# Patient Record
Sex: Female | Born: 1990 | Race: Black or African American | Hispanic: Yes | Marital: Single | Smoking: Never smoker
Health system: Southern US, Community
[De-identification: ages and names within clinical notes are randomized; demographics above are authoritative.]

## PROBLEM LIST (undated history)

## (undated) DIAGNOSIS — G43909 Migraine, unspecified, not intractable, without status migrainosus: Secondary | ICD-10-CM

---

## 2012-10-16 ENCOUNTER — Emergency Department (HOSPITAL_COMMUNITY)
Admission: EM | Admit: 2012-10-16 | Discharge: 2012-10-16 | Disposition: A | Discharge status: Home / Self Care | Payer: BC Managed Care – PPO | Attending: Emergency Medicine | Admitting: Emergency Medicine

## 2012-10-16 ENCOUNTER — Encounter (HOSPITAL_COMMUNITY): Payer: Self-pay | Admitting: *Deleted

## 2012-10-16 DIAGNOSIS — Y9389 Activity, other specified: Secondary | ICD-10-CM | POA: Insufficient documentation

## 2012-10-16 DIAGNOSIS — S0990XA Unspecified injury of head, initial encounter: Secondary | ICD-10-CM | POA: Insufficient documentation

## 2012-10-16 DIAGNOSIS — Y92410 Unspecified street and highway as the place of occurrence of the external cause: Secondary | ICD-10-CM | POA: Insufficient documentation

## 2012-10-16 HISTORY — DX: Migraine, unspecified, not intractable, without status migrainosus: G43.909

## 2012-10-16 MED ORDER — CYCLOBENZAPRINE HCL 5 MG PO TABS: 7.5000 mg | ORAL_TABLET | Freq: Three times a day (TID) | ORAL | Status: DC | PRN

## 2012-10-16 MED ORDER — IBUPROFEN 400 MG PO TABS
600.0000 mg | ORAL_TABLET | Freq: Once | ORAL | Status: AC
  Administered 2012-10-16: 600 mg via ORAL
  Filled 2012-10-16: qty 1

## 2012-10-16 MED ORDER — IBUPROFEN 600 MG PO TABS: 600.0000 mg | ORAL_TABLET | Freq: Four times a day (QID) | ORAL | Status: DC | PRN
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
## 2012-10-16 NOTE — ED Notes (Signed)
Pt ambulatory leaving ED. Pt given d/c teaching and prescriptions. Pt has no further questions upon d/c.
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
## 2012-10-20 ENCOUNTER — Emergency Department (HOSPITAL_COMMUNITY)
Admission: EM | Admit: 2012-10-20 | Discharge: 2012-10-20 | Disposition: A | Discharge status: Home / Self Care | Payer: BC Managed Care – PPO | Attending: Emergency Medicine | Admitting: Emergency Medicine

## 2012-10-20 ENCOUNTER — Encounter (HOSPITAL_COMMUNITY): Payer: Self-pay | Admitting: Family Medicine

## 2012-10-20 DIAGNOSIS — N390 Urinary tract infection, site not specified: Secondary | ICD-10-CM | POA: Insufficient documentation

## 2012-10-20 DIAGNOSIS — Z8679 Personal history of other diseases of the circulatory system: Secondary | ICD-10-CM | POA: Insufficient documentation

## 2012-10-20 DIAGNOSIS — R3915 Urgency of urination: Secondary | ICD-10-CM | POA: Insufficient documentation

## 2012-10-20 DIAGNOSIS — R1033 Periumbilical pain: Secondary | ICD-10-CM | POA: Insufficient documentation

## 2012-10-20 DIAGNOSIS — Z3202 Encounter for pregnancy test, result negative: Secondary | ICD-10-CM | POA: Insufficient documentation

## 2012-10-20 LAB — URINALYSIS, ROUTINE W REFLEX MICROSCOPIC
Glucose, UA: NEGATIVE mg/dL
pH: 7 (ref 5.0–8.0)

## 2012-10-20 LAB — URINE MICROSCOPIC-ADD ON

## 2012-10-20 LAB — PREGNANCY, URINE: Preg Test, Ur: NEGATIVE

## 2012-10-20 MED ORDER — PHENAZOPYRIDINE HCL 200 MG PO TABS: 200.0000 mg | ORAL_TABLET | Freq: Three times a day (TID) | ORAL | Status: DC

## 2012-10-20 MED ORDER — CEPHALEXIN 500 MG PO CAPS: 500.0000 mg | ORAL_CAPSULE | Freq: Four times a day (QID) | ORAL | Status: DC

## 2012-10-20 NOTE — ED Provider Notes (Signed)
History   This chart was scribed for Glynn Octave, MD by Toya Smothers, ED Scribe. The patient was seen in room TR11C/TR11C. Patient's care was started at 1039.   CSN: [id number]  Arrival date & time 10/20/12  1039   First MD Initiated Contact with Patient 10/20/12 1102      Chief Complaint  Patient presents with  . Urinary Tract Infection    HPI  Brooke Sheppard is a 21 y.o. female who presents to the Emergency Department complaining of 6 hours of new, sudden onset, unchanged moderate dysuria and periumbilical pain. Pain is sharp and burning. No alleviators or aggravators. Pt was well until this morning when she began experiencing symptoms. Symptoms have not been treated PTA. No hematuria, fever, chills, cough, congestion, rhinorrhea, chest pain, SOB, or n/v/d. Pt denies use of tobacco products, consumption of alcohol, and use of illicit drugs. Pt was in     Past Medical History  Diagnosis Date  . Migraines     History reviewed. No pertinent past surgical history.  History reviewed. No pertinent family history.  History  Substance Use Topics  . Smoking status: Never Smoker   . Smokeless tobacco: Not on file  . Alcohol Use: Yes    Review of Systems  Gastrointestinal: Positive for abdominal pain. Negative for nausea, vomiting and diarrhea.  Genitourinary: Positive for dysuria and urgency. Negative for hematuria.  All other systems reviewed and are negative.    Allergies  Other  Home Medications   Current Outpatient Rx  Name  Route  Sig  Dispense  Refill  . ASPIRIN-ACETAMINOPHEN-CAFFEINE 250-250-65 MG PO TABS   Oral   Take 1 tablet by mouth every 6 (six) hours as needed. For headache         . CYCLOBENZAPRINE HCL 5 MG PO TABS   Oral   Take 1.5 tablets (7.5 mg total) by mouth 3 (three) times daily as needed for muscle spasms (as needed.  Avoid driving or operating machienery while taking.  Used best at night).   10 tablet   0   . IBUPROFEN 600 MG PO TABS  Oral   Take 1 tablet (600 mg total) by mouth every 6 (six) hours as needed for pain.   30 tablet   0   . CEPHALEXIN 500 MG PO CAPS   Oral   Take 1 capsule (500 mg total) by mouth 4 (four) times daily.   40 capsule   0   . PHENAZOPYRIDINE HCL 200 MG PO TABS   Oral   Take 1 tablet (200 mg total) by mouth 3 (three) times daily.   6 tablet   0     BP 130/82  Pulse 123  Temp 97.6 F (36.4 C) (Oral)  Resp 20  SpO2 100%  LMP 10/19/2012  Physical Exam  Nursing note and vitals reviewed. Constitutional: She appears well-developed and well-nourished.  HENT:  Head: Normocephalic and atraumatic.  Eyes: Conjunctivae normal are normal. Pupils are equal, round, and reactive to light.  Neck: Neck supple. No tracheal deviation present. No thyromegaly present.       No CVA tenderness.  Cardiovascular: Normal rate and regular rhythm.   No murmur heard. Pulmonary/Chest: Effort normal and breath sounds normal.  Abdominal: Soft. Bowel sounds are normal. She exhibits no distension. There is no tenderness.       No abdominal pain.  Musculoskeletal: Normal range of motion. She exhibits no edema and no tenderness.  Neurological: She is alert. Coordination normal.  Skin:  Skin is warm and dry. No rash noted.  Psychiatric: She has a normal mood and affect.    ED Course  Procedures DIAGNOSTIC STUDIES: Oxygen Saturation is 100% on room air, normal by my interpretation.    COORDINATION OF CARE: 11:06- Evaluated Pt. Pt is awake, alert, and without distress.     Labs Reviewed  URINALYSIS, ROUTINE W REFLEX MICROSCOPIC - Abnormal; Notable for the following:    APPearance CLOUDY (*)     Hgb urine dipstick LARGE (*)     Leukocytes, UA MODERATE (*)     All other components within normal limits  URINE MICROSCOPIC-ADD ON  PREGNANCY, URINE  URINE CULTURE   No results found.   1. Urinary tract infection       MDM  Dysuria and odorous urine for the past day. No abdominal pain, fever,  vomiting, vaginal discharge. On menses now. Patient appears well. Abdomen soft and nonsurgical. Heart rate improved with rechecked to 86.  +LE, WBCs, blood.  On menses.  Culture sent.  HCG negative.  Start keflex, send urine culture.   I personally performed the services described in this documentation, which was scribed in my presence. The recorded information has been reviewed and is accurate.    Glynn Octave, MD 10/20/12 [phone]

## 2012-10-20 NOTE — ED Notes (Signed)
Per pt sts this morning burning in urine and foul odor. sts she think she has a UTI.

## 2012-10-21 LAB — URINE CULTURE

## 2019-02-21 ENCOUNTER — Other Ambulatory Visit: Payer: Self-pay

## 2019-02-21 ENCOUNTER — Ambulatory Visit: Payer: Self-pay

## 2019-02-21 ENCOUNTER — Emergency Department (HOSPITAL_COMMUNITY)
Admission: EM | Admit: 2019-02-21 | Discharge: 2019-02-21 | Disposition: A | Discharge status: Home / Self Care | Payer: 59 | Attending: Emergency Medicine | Admitting: Emergency Medicine

## 2019-02-21 DIAGNOSIS — Z20828 Contact with and (suspected) exposure to other viral communicable diseases: Secondary | ICD-10-CM | POA: Diagnosis not present

## 2019-02-21 DIAGNOSIS — R05 Cough: Secondary | ICD-10-CM | POA: Diagnosis not present

## 2019-02-21 DIAGNOSIS — Z79899 Other long term (current) drug therapy: Secondary | ICD-10-CM | POA: Insufficient documentation

## 2019-02-21 DIAGNOSIS — R51 Headache: Secondary | ICD-10-CM | POA: Diagnosis not present

## 2019-02-21 DIAGNOSIS — Z20822 Contact with and (suspected) exposure to covid-19: Secondary | ICD-10-CM

## 2019-02-21 MED ORDER — BENZONATATE 100 MG PO CAPS: 100.0000 mg | ORAL_CAPSULE | Freq: Three times a day (TID) | ORAL | 0 refills | Status: DC

## 2019-02-21 NOTE — ED Provider Notes (Signed)
[facility]-EMERGENCY DEPT Provider Note   CSN: [id number] Arrival date & time: 02/21/19  1610    History   Chief Complaint Chief Complaint  Patient presents with  . Headache  . Cough    HPI Hikari Durwin NoraDixon is a 28 y.o. female.     28 yo F with a cc of being exposed to Covid 19.  Brother diagnosed with it recently, saw him 4 days ago and was in close contact.  Having a mild cough, headache with coughing.  CP yesterday but resolved.  No sob.  No fevers.  No other exposure.   The history is provided by the patient.  Illness  Severity:  Mild Onset quality:  Gradual Duration:  2 days Timing:  Constant Progression:  Unchanged Chronicity:  New Associated symptoms: chest pain (yesterday now resolved), cough and headaches   Associated symptoms: no congestion, no fever, no myalgias, no nausea, no rhinorrhea, no shortness of breath, no vomiting and no wheezing     Past Medical History:  Diagnosis Date  . Migraines     There are no active problems to display for this patient.   No past surgical history on file.   OB History   No obstetric history on file.      Home Medications    Prior to Admission medications   Medication Sig Start Date End Date Taking? Authorizing Provider  aspirin-acetaminophen-caffeine (EXCEDRIN MIGRAINE) [id number] MG per tablet Take 1 tablet by mouth every 6 (six) hours as needed. For headache    [provider]  benzonatate (TESSALON) 100 MG capsule Take 1 capsule (100 mg total) by mouth every 8 (eight) hours. 02/21/19   Melene PlanFloyd, Ahlaya Ende, DO  cephALEXin (KEFLEX) 500 MG capsule Take 1 capsule (500 mg total) by mouth 4 (four) times daily. 10/20/12   Rancour, Jeannett SeniorStephen, MD  cyclobenzaprine (FLEXERIL) 5 MG tablet Take 1.5 tablets (7.5 mg total) by mouth 3 (three) times daily as needed for muscle spasms (as needed.  Avoid driving or operating machienery while taking.  Used best at night). 10/16/12   Bonk, Orson AloeJohn-Adam, MD  ibuprofen  (ADVIL,MOTRIN) 600 MG tablet Take 1 tablet (600 mg total) by mouth every 6 (six) hours as needed for pain. 10/16/12   Bonk, Orson AloeJohn-Adam, MD  phenazopyridine (PYRIDIUM) 200 MG tablet Take 1 tablet (200 mg total) by mouth 3 (three) times daily. 10/20/12   Glynn Octaveancour, Stephen, MD    Family History No family history on file.  Social History Social History   Tobacco Use  . Smoking status: Never Smoker  Substance Use Topics  . Alcohol use: Yes  . Drug use: Not on file     Allergies   Other   Review of Systems Review of Systems  Constitutional: Negative for chills and fever.  HENT: Negative for congestion and rhinorrhea.   Eyes: Negative for redness and visual disturbance.  Respiratory: Positive for cough. Negative for shortness of breath and wheezing.   Cardiovascular: Positive for chest pain (yesterday now resolved). Negative for palpitations.  Gastrointestinal: Negative for nausea and vomiting.  Genitourinary: Negative for dysuria and urgency.  Musculoskeletal: Negative for arthralgias and myalgias.  Skin: Negative for pallor and wound.  Neurological: Positive for headaches. Negative for dizziness.     Physical Exam Updated Vital Signs BP 116/81 (BP Location: Left Arm)   Pulse 87   Temp 99 F (37.2 C) (Oral)   Resp 18   Wt 74.8 kg   SpO2 100%   Physical Exam Vitals signs and  nursing note reviewed.  Constitutional:      General: She is not in acute distress.    Appearance: She is well-developed. She is not diaphoretic.  HENT:     Head: Normocephalic and atraumatic.  Eyes:     Pupils: Pupils are equal, round, and reactive to light.  Neck:     Musculoskeletal: Normal range of motion and neck supple.  Pulmonary:     Effort: Pulmonary effort is normal. No respiratory distress.  Abdominal:     Palpations: Abdomen is soft.  Musculoskeletal: Normal range of motion.  Skin:    General: Skin is warm and dry.  Neurological:     Mental Status: She is alert and oriented to  person, place, and time.  Psychiatric:        Behavior: Behavior normal.      ED Treatments / Results  Labs (all labs ordered are listed, but only abnormal results are displayed) Labs Reviewed - No data to display  EKG None  Radiology No results found.  Procedures Procedures (including critical care time)  Medications Ordered in ED Medications - No data to display   Initial Impression / Assessment and Plan / ED Course  I have reviewed the triage vital signs and the nursing notes.  Pertinent labs & imaging results that were available during my care of the patient were reviewed by me and considered in my medical decision making (see chart for details).        28 yo F with known exposure to covid-19.  Having URI symptoms, no fever, no sob.  Vitals unremarkable.  Well appearing and non toxic.  Will have self quarantine at home.    Vonita Stelzner was evaluated in Emergency Department on 02/21/2019 for the symptoms described in the history of present illness. He/she was evaluated in the context of the global COVID-19 pandemic, which necessitated consideration that the patient might be at risk for infection with the SARS-CoV-2 virus that causes COVID-19. Institutional protocols and algorithms that pertain to the evaluation of patients at risk for COVID-19 are in a state of rapid change based on information released by regulatory bodies including the CDC and federal and state organizations. These policies and algorithms were followed during the patient's care in the ED.  5:13 PM:  I have discussed the diagnosis/risks/treatment options with the patient and believe the pt to be eligible for discharge home to follow-up with PCP. We also discussed returning to the ED immediately if new or worsening sx occur. We discussed the sx which are most concerning (e.g., sudden worsening sob, fever) that necessitate immediate return. Medications administered to the patient during their visit and any new  prescriptions provided to the patient are listed below.  Medications given during this visit Medications - No data to display   The patient appears reasonably screen and/or stabilized for discharge and I doubt any other medical condition or other [facility] requiring further screening, evaluation, or treatment in the ED at this time prior to discharge.    Final Clinical Impressions(s) / ED Diagnoses   Final diagnoses:  Exposure to Covid-19 Virus    ED Discharge Orders         Ordered    benzonatate (TESSALON) 100 MG capsule  Every 8 hours     02/21/19 1647           Melene Plan, DO 02/21/19 1714

## 2019-02-21 NOTE — Telephone Encounter (Signed)
Pt called stating that her brother has just tested positive for COVID-19. She states that the spent Saturday night and part of Sunday together.  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 She has a dry cough no fever.she has a headache. She states she is not SOB.  She says that yesterday she had some chest pain but not today. Per protocol pt will be see at [facility] ER. Report to Tim. Pt was give isolation instructions and verbalized understanding of these instructions.  Reason for Disposition . [1] Fever (or feeling feverish) OR cough AND [2] within 14 Days of COVID-19 EXPOSURE (Close Contact)  Answer Assessment - Initial Assessment Questions 1. CLOSE CONTACT: "Who is the person with the confirmed or suspected COVID-19 infection that you were exposed to?"     Brother 2. PLACE of CONTACT: "Where were you when you were exposed to COVID-19?" (e.g., home, school, medical waiting room; which city?)     home 3. TYPE of CONTACT: "How much contact was there?" (e.g., sitting next to, live in same house, work in same office, same building)    Sunday and Monday together do not live in same house 4. DURATION of CONTACT: "How long were you in contact with the COVID-19 patient?" (e.g., a few seconds, passed by person, a few minutes, live with the patient)     Sunday night all night, Monday for 4 hours 5. DATE of CONTACT: "When did you have contact with a COVID-19 patient?" (e.g., how many days ago)    2 days ago Monday 6. TRAVEL: "Have you traveled out of the country recently?" If so, "When and where?"     * Also ask about out-of-state travel, since the CDC has identified some high risk cities for community spread in the [place].     * Note: Travel becomes less relevant if there is widespread community transmission where the patient lives.    No 7. COMMUNITY SPREAD: "Are there lots of cases or COVID-19 (community spread) where you live?" (See public health department website, if unsure)   *  MAJOR community spread: high number of cases; numbers of cases are increasing; many people hospitalized.   * MINOR community spread: low number of cases; not increasing; few or no people hospitalized     Yes [place] works in [employer] 8. SYMPTOMS: "Do you have any symptoms?" (e.g., fever, cough, breathing difficulty)     Dry cough headache 9. PREGNANCY OR POSTPARTUM: "Is there any chance you are pregnant?" "When was your last menstrual period?" "Did you deliver in the last 2 weeks?"     Last week Menses 10. HIGH RISK: "Do you have any heart or lung problems? Do you have a weak immune system?" (e.g., CHF, COPD, asthma, HIV positive, chemotherapy, renal failure, diabetes mellitus, sickle cell anemia)      no  Protocols used: CORONAVIRUS (COVID-19) EXPOSURE-A-AH

## 2019-02-21 NOTE — ED Notes (Signed)
Pt called. Per registration: Pt outside. Will call again momentarily.

## 2019-02-21 NOTE — Discharge Instructions (Addendum)
Person Under Monitoring Name: Brooke Sheppard  Location: [address] [place] [address]   Infection Prevention Recommendations for Individuals Confirmed to have, or Being Evaluated for, 2019 Novel Coronavirus (COVID-19) Infection Who Receive Care at Home  Individuals who are confirmed to have, or are being evaluated for, COVID-19 should follow the prevention steps below until a healthcare provider or local or state health department says they can return to normal activities.  Stay home except to get medical care You should restrict activities outside your home, except for getting medical care. Do not go to work, school, or public areas, and do not use public transportation or taxis.  Call ahead before visiting your doctor Before your medical appointment, call the healthcare provider and tell them that you have, or are being evaluated for, COVID-19 infection. This will help the healthcare providers office take steps to keep other people from getting infected. Ask your healthcare provider to call the local or state health department.  Monitor your symptoms Seek prompt medical attention if your illness is worsening (e.g., difficulty breathing). Before going to your medical appointment, call the healthcare provider and tell them that you have, or are being evaluated for, COVID-19 infection. Ask your healthcare provider to call the local or state health department.  Wear a facemask You should wear a facemask that covers your nose and mouth when you are in the same room with other people and when you visit a healthcare provider. People who live with or visit you should also wear a facemask while they are in the same room with you.  Separate yourself from other people in your home As much as possible, you should stay in a different room from other people in your home. Also, you should use a separate bathroom, if available.  Avoid sharing household items You should not share  dishes, drinking glasses, cups, eating utensils, towels, bedding, or other items with other people in your home. After using these items, you should wash them thoroughly with soap and water.  Cover your coughs and sneezes Cover your mouth and nose with a tissue when you cough or sneeze, or you can cough or sneeze into your sleeve. Throw used tissues in a lined trash can, and immediately wash your hands with soap and water for at least 20 seconds or use an alcohol-based hand rub.  Wash your [employer] your hands often and thoroughly with soap and water for at least 20 seconds. You can use an alcohol-based hand sanitizer if soap and water are not available and if your hands are not visibly dirty. Avoid touching your eyes, nose, and mouth with unwashed hands.   Prevention Steps for Caregivers and Household Members of Individuals Confirmed to have, or Being Evaluated for, COVID-19 Infection Being Cared for in the Home  If you live with, or provide care at home for, a person confirmed to have, or being evaluated for, COVID-19 infection please follow these guidelines to prevent infection:  Follow healthcare providers instructions Make sure that you understand and can help the patient follow any healthcare provider instructions for all care.  Provide for the patients basic needs You should help the patient with basic needs in the home and provide support for getting groceries, prescriptions, and other personal needs.  Monitor the patients symptoms If they are getting sicker, call his or her medical provider and tell them that the patient has, or is being evaluated for, COVID-19 infection. This will help the healthcare providers office  take steps to keep other people from getting infected. °Ask the healthcare provider to call the local or state health department. ° °Limit the number of people who have contact with the patient °If possible, have only one caregiver for the patient. °Other  household members should stay in another home or place of residence. If this is not possible, they should stay °in another room, or be separated from the patient as much as possible. Use a separate bathroom, if available. °Restrict visitors who do not have an essential need to be in the home. ° °Keep older adults, very young children, and other sick people away from the patient °Keep older adults, very young children, and those who have compromised immune systems or chronic health conditions away from the patient. This includes people with chronic heart, lung, or kidney conditions, diabetes, and cancer. ° °Ensure good ventilation °Make sure that shared spaces in the home have good air flow, such as from an air conditioner or an opened window, °weather permitting. ° °Wash your hands often °Wash your hands often and thoroughly with soap and water for at least 20 seconds. You can use an alcohol based hand sanitizer if soap and water are not available and if your hands are not visibly dirty. °Avoid touching your eyes, nose, and mouth with unwashed hands. °Use disposable paper towels to dry your hands. If not available, use dedicated cloth towels and replace them when they become wet. ° °Wear a facemask and gloves °Wear a disposable facemask at all times in the room and gloves when you touch or have contact with the patient’s blood, body fluids, and/or secretions or excretions, such as sweat, saliva, sputum, nasal mucus, vomit, urine, or feces.  Ensure the mask fits over your nose and mouth tightly, and do not touch it during use. °Throw out disposable facemasks and gloves after using them. Do not reuse. °Wash your hands immediately after removing your facemask and gloves. °If your personal clothing becomes contaminated, carefully remove clothing and launder. Wash your hands after handling contaminated clothing. °Place all used disposable facemasks, gloves, and other waste in a lined container before disposing them with  other household waste. °Remove gloves and wash your hands immediately after handling these items. ° °Do not share dishes, glasses, or other household items with the patient °Avoid sharing household items. You should not share dishes, drinking glasses, cups, eating utensils, towels, bedding, or other items with a patient who is confirmed to have, or being evaluated for, COVID-19 infection. °After the person uses these items, you should wash them thoroughly with soap and water. ° °Wash laundry thoroughly °Immediately remove and wash clothes or bedding that have blood, body fluids, and/or secretions or excretions, such as sweat, saliva, sputum, nasal mucus, vomit, urine, or feces, on them. °Wear gloves when handling laundry from the patient. °Read and follow directions on labels of laundry or clothing items and detergent. In general, wash and dry with the warmest temperatures recommended on the label. ° °Clean all areas the individual has used often °Clean all touchable surfaces, such as counters, tabletops, doorknobs, bathroom fixtures, toilets, phones, keyboards, tablets, and bedside tables, every day. Also, clean any surfaces that may have blood, body fluids, and/or secretions or excretions on them. °Wear gloves when cleaning surfaces the patient has come in contact with. °Use a diluted bleach solution (e.g., dilute bleach with 1 part bleach and 10 parts water) or a household disinfectant with a label that says EPA-registered for coronaviruses. To make a bleach   solution at home, add 1 tablespoon of bleach to 1 quart (4 cups) of water. For a larger supply, add  cup of bleach to 1 gallon (16 cups) of water. Read labels of cleaning products and follow recommendations provided on product labels. Labels contain instructions for safe and effective use of the cleaning product including precautions you should take when applying the product, such as wearing gloves or eye protection and making sure you have good ventilation  during use of the product. Remove gloves and wash hands immediately after cleaning.  Monitor yourself for signs and symptoms of illness Caregivers and household members are considered close contacts, should monitor their health, and will be asked to limit movement outside of the home to the extent possible. Follow the monitoring steps for close contacts listed on the symptom monitoring form.   ? If you have additional questions, contact your local health department or call the epidemiologist on call at [phone] (available 24/7). ? This guidance is subject to change. For the most up-to-date guidance from [facility], please refer to their website: [email or web address]

## 2019-02-21 NOTE — ED Triage Notes (Signed)
Pt reports that her brother tested positive for COVID-19 yesterday. Pt reports recent headache, dry cough and chest discomfort yesterday. Pt denies fevers at home. Pt was advised by her job at [employer] to come to ED. Pt denies chest pain at this time.

## 2019-07-27 ENCOUNTER — Encounter: Payer: Self-pay | Admitting: Family

## 2019-07-27 ENCOUNTER — Other Ambulatory Visit: Payer: Self-pay

## 2019-07-27 ENCOUNTER — Ambulatory Visit (INDEPENDENT_AMBULATORY_CARE_PROVIDER_SITE_OTHER): Payer: 59 | Admitting: Family

## 2019-07-27 ENCOUNTER — Ambulatory Visit (INDEPENDENT_AMBULATORY_CARE_PROVIDER_SITE_OTHER)
Admission: RE | Admit: 2019-07-27 | Discharge: 2019-07-27 | Disposition: A | Discharge status: Home / Self Care | Payer: 59 | Source: Ambulatory Visit | Attending: Family | Admitting: Family

## 2019-07-27 VITALS — BP 116/74 | HR 67 | Temp 98.2°F | Ht 64.0 in | Wt 177.6 lb

## 2019-07-27 DIAGNOSIS — M25562 Pain in left knee: Secondary | ICD-10-CM

## 2019-07-27 NOTE — Progress Notes (Signed)
  Brooke Sheppard is a 28 y.o. female with the following history as recorded in EpicCare:  There are no active problems to display for this patient.   No current outpatient medications on file.   No current facility-administered medications for this visit.     Allergies: Other  Past Medical History:  Diagnosis Date  . Migraines     History reviewed. No pertinent surgical history.  History reviewed. No pertinent family history.  Social History   Tobacco Use  . Smoking status: Never Smoker  Substance Use Topics  . 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  Head: Normocephalic and atraumatic  Musculoskeletal: No deformities; no active joint inflammation; FROM on active and passive motion; no swelling noted;  Extremities: No edema, cyanosis, clubbing  Vessels: Symmetric bilaterally  Neurologic: Alert and oriented; speech intact; face symmetrical; moves all extremities well; CNII-XII intact without focal deficit   Assessment:  1. Left knee pain, unspecified chronicity     Plan:  Will update X-ray today; patient defers trial of any type of medication; refer to orthopedist- explained that I am not able to write a long term  note to reduce her work hours; she needs to talk to her employer to get more formal documentation/ discuss with orthopedist.   No follow-ups on file.  Orders Placed This Encounter  Procedures  . Ambulatory referral to Orthopedic Surgery    Referral Priority:   Routine    Referral Type:   Surgical    Referral Reason:   Specialty Services Required    Requested Specialty:   Orthopedic Surgery    Number of Visits Requested:   1    Requested Prescriptions    No prescriptions requested or ordered in this encounter

## 2019-08-08 ENCOUNTER — Ambulatory Visit (INDEPENDENT_AMBULATORY_CARE_PROVIDER_SITE_OTHER): Payer: 59 | Admitting: Orthopaedic Surgery

## 2019-08-08 ENCOUNTER — Encounter: Payer: Self-pay | Admitting: Orthopaedic Surgery

## 2019-08-08 VITALS — Ht 64.0 in | Wt 176.0 lb

## 2019-08-08 DIAGNOSIS — M7652 Patellar tendinitis, left knee: Secondary | ICD-10-CM

## 2019-08-08 NOTE — Progress Notes (Signed)
Office Visit Note   Patient: Brooke Sheppard           Date of Birth: 05-04-1991           MRN: [id number] Visit Date: 08/08/2019              Requested by: Marrian Salvage, [place],  [place] [address] PCP: Marrian Salvage, FNP   Assessment & Plan: Visit Diagnoses:  1. Patellar tendinitis of left knee     Plan: Impression is left knee patellar tendinitis.  I offered given the patient a patellar tendon strap to help with the pain.  I have also given her recommendations on the appropriate Advil and Aleve dosages.  Also recommended Voltaren.  I get the impression that she mainly just wants to be limited to working 8 hours a day which she requested for the next 8 weeks.  Work note was provided today.  Questions encouraged and answered.  Follow-Up Instructions: Return if symptoms worsen or fail to improve.   Orders:  No orders of the defined types were placed in this encounter.  No orders of the defined types were placed in this encounter.     Procedures: No procedures performed   Clinical Data: No additional findings.   Subjective: Chief Complaint  Patient presents with  . Left Knee - Pain    Brooke Sheppard is a 28 year old female who comes in for evaluation of left knee pain without injury for the last month.  Works at the [employer] and walks 14 miles a day for work.  She states that the pain is worse with flexion of the knee and with increased activity.  She is localizing the pain to the tibial tubercle.  Denies any persistent swelling.  Denies any mechanical symptoms.   Review of Systems  Constitutional: Negative.   HENT: Negative.   Eyes: Negative.   Respiratory: Negative.   Cardiovascular: Negative.   Endocrine: Negative.   Musculoskeletal: Negative.   Neurological: Negative.   Hematological: Negative.   Psychiatric/Behavioral: Negative.   All other systems reviewed and are negative.    Objective: Vital Signs: Ht 5\' 4"  (1.626 m)    Wt 176 lb (79.8 kg)   LMP 07/24/2019   BMI 30.21 kg/m   Physical Exam Vitals signs and nursing note reviewed.  Constitutional:      Appearance: She is well-developed.  HENT:     Head: Normocephalic and atraumatic.  Neck:     Musculoskeletal: Neck supple.  Pulmonary:     Effort: Pulmonary effort is normal.  Abdominal:     Palpations: Abdomen is soft.  Skin:    General: Skin is warm.     Capillary Refill: Capillary refill takes less than 2 seconds.  Neurological:     Mental Status: She is alert and oriented to person, place, and time.  Psychiatric:        Behavior: Behavior normal.        Thought Content: Thought content normal.        Judgment: Judgment normal.     Ortho Exam Left knee exam shows no joint effusion.  No joint line tenderness.  Collaterals and cruciates are stable.  Normal range of motion.  She is quite tender over the tibial tubercle. Specialty Comments:  No specialty comments available.  Imaging: No results found.   PMFS History: Patient Active Problem List   Diagnosis Date Noted  . Patellar tendinitis of left knee 08/08/2019   Past Medical History:  Diagnosis Date  . Migraines     No family history on file.  No past surgical history on file. Social History   Occupational History  . Not on file  Tobacco Use  . Smoking status: Never Smoker  . Smokeless tobacco: Never Used  Substance and Sexual Activity  . Alcohol use: Yes  . Drug use: Not on file  . Sexual activity: Not on file

## 2019-08-22 ENCOUNTER — Other Ambulatory Visit: Payer: Self-pay | Admitting: Critical Care Medicine

## 2019-08-22 DIAGNOSIS — Z20822 Contact with and (suspected) exposure to covid-19: Secondary | ICD-10-CM

## 2019-08-24 LAB — NOVEL CORONAVIRUS, NAA: SARS-CoV-2, NAA: NOT DETECTED

## 2019-09-27 ENCOUNTER — Ambulatory Visit (INDEPENDENT_AMBULATORY_CARE_PROVIDER_SITE_OTHER): Payer: 59 | Admitting: Family Medicine

## 2019-09-27 ENCOUNTER — Encounter: Payer: Self-pay | Admitting: Family Medicine

## 2019-09-27 ENCOUNTER — Ambulatory Visit: Payer: Self-pay

## 2019-09-27 VITALS — BP 100/72 | HR 73 | Ht 64.0 in

## 2019-09-27 DIAGNOSIS — M25562 Pain in left knee: Secondary | ICD-10-CM

## 2019-09-27 DIAGNOSIS — G8929 Other chronic pain: Secondary | ICD-10-CM

## 2019-09-27 DIAGNOSIS — M222X2 Patellofemoral disorders, left knee: Secondary | ICD-10-CM | POA: Insufficient documentation

## 2019-09-27 NOTE — Assessment & Plan Note (Signed)
I believe the patient is having more of a patellofemoral syndrome.  Patellar tendinitis seems to have improved from previous visit reviewed the provider.  Discussed with patient icing regimen, home exercises, given Tru pull lite brace, patient will continue on the restriction of 8-hour shifts follow-up with me again in 2 months.

## 2019-09-27 NOTE — Progress Notes (Signed)
[name] [address] [place], [place] [address] Phone: [phone] Subjective:   I Brooke Sheppard am serving as a Neurosurgeon for Dr. Antoine Primas.  I'm seeing this patient by the request  of:  Olive Bass, FNP   CC: Left knee pain   [id number]  Brooke Sheppard is a 28 y.o. female coming in with complaint of left knee pain. 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 Wants to be on a permanent 8 hour restriction normally works 12-13 hours. Pain at the end of the day. When she works longer days the knee goes numb   Onset- 3 months  Location - lateral, joint  Duration-  Character- throbbing, sore, sharp  Aggravating factors- flexion  Reliving factors-  Therapies tried- ice, heat  Severity-  8-9/10   X-rays were independently visualized by me.  The x-ray showed the patient did have mild medial compartment degenerative changes.  Past Medical History:  Diagnosis Date  . Migraines    No past surgical history on file. Social History   Socioeconomic History  . Marital status: Single    Spouse name: Not on file  . Number of children: Not on file  . Years of education: Not on file  . Highest education level: Not on file  Occupational History  . Not on file  Social Needs  . Financial resource strain: Not on file  . Food insecurity    Worry: Not on file    Inability: Not on file  . Transportation needs    Medical: Not on file    Non-medical: Not on file  Tobacco Use  . Smoking status: Never Smoker  . Smokeless tobacco: Never Used  Substance and Sexual Activity  . Alcohol use: Yes  . Drug use: Not on file  . Sexual activity: Not on file  Lifestyle  . Physical activity    Days per week: Not on file    Minutes per session: Not on file  . Stress: Not on file  Relationships  . Social Musician on phone: Not on file    Gets together: Not on file    Attends religious service: Not on file   Active member of club or organization: Not on file    Attends meetings of clubs or organizations: Not on file    Relationship status: Not on file  Other Topics Concern  . Not on file  Social History Narrative  . Not on file   Allergies  Allergen Reactions  . Latex   . Other     Peaches cause swelling and itching   No family history on file. No current outpatient medications on file.    Past medical history, social, surgical and family history all reviewed in electronic medical record.  No pertanent information unless stated regarding to the chief complaint.   Review of Systems:  No headache, visual changes, nausea, vomiting, diarrhea, constipation, dizziness, abdominal pain, skin rash, fevers, chills, night sweats, weight loss, swollen lymph nodes, body aches, joint swelling, muscle aches, chest pain, shortness of breath, mood changes.   Objective  Blood pressure 100/72, pulse 73, height 5\' 4"  (1.626 m), SpO2 98 %.   General: No apparent distress alert and oriented x3 mood and affect normal, dressed appropriately.  HEENT: Pupils equal, extraocular movements intact  Respiratory: Patient's speak in full sentences and does not appear short of breath  Cardiovascular: No lower extremity edema, non tender,  no erythema  Skin: Warm dry intact with no signs of infection or rash on extremities or on axial skeleton.  Abdomen: Soft nontender  Neuro: Cranial nerves II through XII are intact, neurovascularly intact in all extremities with 2+ DTRs and 2+ pulses.  Lymph: No lymphadenopathy of posterior or anterior cervical chain or axillae bilaterally.  Gait normal with good balance and coordination.  MSK:  tender with full range of motion and good stability and symmetric strength and tone of shoulders, elbows, wrist, hip, and ankles bilaterally.  Patient's left knee exam shows the patient does have some mild lateral tracking of the patella.  Pain on the anterior aspect of the patella noted.   Patient does have crepitus noted.  No instability of the knee itself.  Full range of motion. Contralateral knee mild lateral tracking but no pain.  Limited musculoskeletal ultrasound performed interpreted by Lyndal Pulley  Limited ultrasound of patient's knee shows that no significant effusion noted.  Meniscus appear to be unremarkable.  No signs of chondromalacia noted.  Patella tendon normal. Impression: Normal  [address]; 15 additional minutes spent for Therapeutic exercises as stated in above notes.  This included exercises focusing on stretching, strengthening, with significant focus on eccentric aspects.   Long term goals include an improvement in range of motion, strength, endurance as well as avoiding reinjury. Patient's frequency would include in 1-2 times a day, 3-5 times a week for a duration of 6-12 weeks. Reviewed anatomy using anatomical model and how PFS occurs.  Given rehab exercises handout for VMO, hip abductors, core, entire kinetic chain including proprioception exercises.  Could benefit from PT, regular exercise, upright biking, and a PFS knee brace to assist with tracking abnormalities. Proper technique shown and discussed handout in great detail with ATC.  All questions were discussed and answered.     Impression and Recommendations:     This case required medical decision making of moderate complexity. The above documentation has been reviewed and is accurate and complete Lyndal Pulley, DO       Note: This dictation was prepared with Dragon dictation along with smaller phrase technology. Any transcriptional errors that result from this process are unintentional.

## 2019-09-27 NOTE — Patient Instructions (Addendum)
Good to see you See me again in 2 months 

## 2019-10-23 ENCOUNTER — Other Ambulatory Visit: Payer: Self-pay

## 2019-10-23 DIAGNOSIS — Z20822 Contact with and (suspected) exposure to covid-19: Secondary | ICD-10-CM

## 2019-10-25 LAB — NOVEL CORONAVIRUS, NAA: SARS-CoV-2, NAA: NOT DETECTED

## 2019-11-27 ENCOUNTER — Other Ambulatory Visit: Payer: Self-pay

## 2019-11-27 ENCOUNTER — Ambulatory Visit (INDEPENDENT_AMBULATORY_CARE_PROVIDER_SITE_OTHER): Payer: 59 | Admitting: Family Medicine

## 2019-11-27 ENCOUNTER — Encounter: Payer: Self-pay | Admitting: Family Medicine

## 2019-11-27 DIAGNOSIS — M222X2 Patellofemoral disorders, left knee: Secondary | ICD-10-CM | POA: Diagnosis not present

## 2019-11-27 NOTE — Progress Notes (Signed)
[name] [address] [place] [address] Phone: [phone] Subjective:   Brooke Sheppard, am serving as a scribe for Dr. Antoine Primas. This visit occurred during the SARS-CoV-2 public health emergency.  Safety protocols were in place, including screening questions prior to the visit, additional usage of staff PPE, and extensive cleaning of exam room while observing appropriate contact time as indicated for disinfecting solutions.    CC: Left knee pain follow-up  PIR:[id number]    09/27/2019 I believe the patient is having more of a patellofemoral syndrome.  Patellar tendinitis seems to have improved from previous visit reviewed the provider.  Discussed with patient icing regimen, home exercises, given Tru pull lite brace, patient will continue on the restriction of 8-hour shifts follow-up with me again in 2 months.  Update 11/27/2019 Brooke Sheppard is a 29 y.o. female coming in with complaint of left knee pain. Patient states that she is on 8 hour work restriction and feels that she can do 10 hours but is unable to do 12 hours. Does feel that her knee pain is improving and she is using brace during work. Has not had an increase in swelling.  Patient states that the pain is not as severe as what it has been.  Feels about 75 to 80% better.    Past Medical History:  Diagnosis Date  . Migraines    No past surgical history on file. Social History   Socioeconomic History  . Marital status: Single    Spouse name: Not on file  . Number of children: Not on file  . Years of education: Not on file  . Highest education level: Not on file  Occupational History  . Not on file  Tobacco Use  . Smoking status: Never Smoker  . Smokeless tobacco: Never Used  Substance and Sexual Activity  . Alcohol use: Yes  . Drug use: Not on file  . Sexual activity: Not on file  Other Topics Concern  . Not on file  Social History Narrative  . Not on file   Social  Determinants of Health   Financial Resource Strain:   . Difficulty of Paying Living Expenses: Not on file  Food Insecurity:   . Worried About Programme researcher, broadcasting/film/video in the Last Year: Not on file  . Ran Out of Food in the Last Year: Not on file  Transportation Needs:   . Lack of Transportation (Medical): Not on file  . Lack of Transportation (Non-Medical): Not on file  Physical Activity:   . Days of Exercise per Week: Not on file  . Minutes of Exercise per Session: Not on file  Stress:   . Feeling of Stress : Not on file  Social Connections:   . Frequency of Communication with Friends and Family: Not on file  . Frequency of Social Gatherings with Friends and Family: Not on file  . Attends Religious Services: Not on file  . Active Member of Clubs or Organizations: Not on file  . Attends Banker Meetings: Not on file  . Marital Status: Not on file   Allergies  Allergen Reactions  . Latex   . Other     Peaches cause swelling and itching   No family history on file. No current outpatient medications on file.    Past medical history, social, surgical and family history all reviewed in electronic medical record.  No pertanent information unless stated regarding to the chief complaint.   Review  of Systems:  No headache, visual changes, nausea, vomiting, diarrhea, constipation, dizziness, abdominal pain, skin rash, fevers, chills, night sweats, weight loss, swollen lymph nodes, body aches, joint swelling, muscle aches, chest pain, shortness of breath, mood changes.   Objective  Blood pressure 110/76, pulse (!) 101, height 5\' 4"  (1.626 m), weight 183 lb (83 kg), SpO2 99 %. Systems examined below as of    General: No apparent distress alert and oriented x3 mood and affect normal, dressed appropriately.  HEENT: Pupils equal, extraocular movements intact  Respiratory: Patient's speak in full sentences and does not appear short of breath  Cardiovascular: No lower extremity  edema, non tender, no erythema  Skin: Warm dry intact with no signs of infection or rash on extremities or on axial skeleton.  Abdomen: Soft nontender  Neuro: Cranial nerves II through XII are intact, neurovascularly intact in all extremities with 2+ DTRs and 2+ pulses.  Lymph: No lymphadenopathy of posterior or anterior cervical chain or axillae bilaterally.  Gait normal with good balance and coordination.  MSK:  tender with full range of motion and good stability and symmetric strength and tone of shoulders, elbows, wrist, hip, and ankles bilaterally.  Left knee exam he is making significant progress.  Patient is not having as much tenderness to palpation at this moment.  Patient has very mild patella grind.  Lateral tracking is improved.  Full range of motion of extension and flexion noted.   Impression and Recommendations:      The above documentation has been reviewed and is accurate and complete Lyndal Pulley, DO       Note: This dictation was prepared with Dragon dictation along with smaller phrase technology. Any transcriptional errors that result from this process are unintentional.

## 2019-11-27 NOTE — Assessment & Plan Note (Signed)
Patient is making significant progress at this time.  I believe the patient will do well with conservative therapy and some brace at work.  Extend patient to 10-hour shifts for the next 2 weeks and a trial of 12-hour shifts thereafter.  Patient will follow up with me at the end of 4 to 6 weeks for further evaluation and treatment.

## 2019-11-27 NOTE — Patient Instructions (Signed)
See me in 6 weeks

## 2019-12-11 ENCOUNTER — Telehealth: Payer: Self-pay

## 2019-12-11 NOTE — Telephone Encounter (Signed)
Letter sent to patient through MyChart

## 2019-12-11 NOTE — Telephone Encounter (Signed)
Patient called stating that she needs her letter for work re-written for 9 hours as she thought she could do the 10 hours and is unable to. Patients  job wants a letter with the 9 hour restriction.

## 2020-01-06 NOTE — Progress Notes (Signed)
[facility] [address] [place] Phone: [phone] Subjective:   I Brooke Sheppard am serving as a Education administrator for Dr. Hulan Saas.  This visit occurred during the SARS-CoV-2 public health emergency.  Safety protocols were in place, including screening questions prior to the visit, additional usage of staff PPE, and extensive cleaning of exam room while observing appropriate contact time as indicated for disinfecting solutions.   I'm seeing this patient by the request  of:  Marrian Salvage, FNP  CC: Left knee pain follow-up  HWE:[id number]   11/27/2019 Patient is making significant progress at this time.  I believe the patient will do well with conservative therapy and some brace at work.  Extend patient to 10-hour shifts for the next 2 weeks and a trial of 12-hour shifts thereafter.  Patient will follow up with me at the end of 4 to 6 weeks for further evaluation and treatment.  Update 01/09/2020 Meliana Mccready is a 29 y.o. female coming in with complaint of left knee pain. Patient states that in the mornings her knees is fine. After work is when she is in the most pain. Pain is getting better. Brace helps. Swelling has gone down and has been icing and exercising. Doesn't want to work more than 9 hours.  Patient feels anything more than 9 hours causes increasing discomfort.       Past Medical History:  Diagnosis Date  . Migraines    No past surgical history on file. Social History   Socioeconomic History  . Marital status: Single    Spouse name: Not on file  . Number of children: Not on file  . Years of education: Not on file  . Highest education level: Not on file  Occupational History  . Not on file  Tobacco Use  . Smoking status: Never Smoker  . Smokeless tobacco: Never Used  Substance and Sexual Activity  . Alcohol use: Yes  . Drug use: Not on file  . Sexual activity: Not on file  Other Topics Concern  . Not on file   Social History Narrative  . Not on file   Social Determinants of Health   Financial Resource Strain:   . Difficulty of Paying Living Expenses: Not on file  Food Insecurity:   . Worried About Charity fundraiser in the Last Year: Not on file  . Ran Out of Food in the Last Year: Not on file  Transportation Needs:   . Lack of Transportation (Medical): Not on file  . Lack of Transportation (Non-Medical): Not on file  Physical Activity:   . Days of Exercise per Week: Not on file  . Minutes of Exercise per Session: Not on file  Stress:   . Feeling of Stress : Not on file  Social Connections:   . Frequency of Communication with Friends and Family: Not on file  . Frequency of Social Gatherings with Friends and Family: Not on file  . Attends Religious Services: Not on file  . Active Member of Clubs or Organizations: Not on file  . Attends Archivist Meetings: Not on file  . Marital Status: Not on file   Allergies  Allergen Reactions  . Latex   . Other     Peaches cause swelling and itching   No family history on file.  No family history of autoimmune No current outpatient medications on file.   Reviewed prior external information including notes and imaging from  primary  care provider As well as notes that were available from care everywhere and other healthcare systems.  Past medical history, social, surgical and family history all reviewed in electronic medical record.  No pertanent information unless stated regarding to the chief complaint.   Review of Systems:  No headache, visual changes, nausea, vomiting, diarrhea, constipation, dizziness, abdominal pain, skin rash, fevers, chills, night sweats, weight loss, swollen lymph nodes, body aches, joint swelling, chest pain, shortness of breath, mood changes. POSITIVE muscle aches  Objective  Blood pressure 100/70, pulse 96, height 5\' 4"  (1.626 m), weight 184 lb (83.5 kg), SpO2 98 %.   General: No apparent distress  alert and oriented x3 mood and affect normal, dressed appropriately.  HEENT: Pupils equal, extraocular movements intact  Respiratory: Patient's speak in full sentences and does not appear short of breath  Cardiovascular: No lower extremity edema, non tender, no erythema  Skin: Warm dry intact with no signs of infection or rash on extremities or on axial skeleton.  Abdomen: Soft nontender  Neuro: Cranial nerves II through XII are intact, neurovascularly intact in all extremities with 2+ DTRs and 2+ pulses.  Lymph: No lymphadenopathy of posterior or anterior cervical chain or axillae bilaterally.  Gait normal with good balance and coordination.  MSK:  Non tender with full range of motion and good stability and symmetric strength and tone of shoulders, elbows, wrist, hip and ankles bilaterally.  Knee: Left Normal to inspection with no erythema or effusion or obvious bony abnormalities. Palpation normal with no warmth, joint line tenderness, patellar tenderness, or condyle tenderness. ROM full in flexion and extension and lower leg rotation. Ligaments with solid consistent endpoints including ACL, PCL, LCL, MCL. Negative Mcmurray's, Apley's, and Thessalonian tests. painful patellar compression. Patellar glide mild crepitus. Patellar and quadriceps tendons unremarkable. Hamstring and quadriceps strength is normal. Anterolateral knee unremarkable   Impression and Recommendations:      The above documentation has been reviewed and is accurate and complete , DO       Note: This dictation was prepared with Dragon dictation along with smaller phrase technology. Any transcriptional errors that result from this process are unintentional.

## 2020-01-09 ENCOUNTER — Ambulatory Visit (INDEPENDENT_AMBULATORY_CARE_PROVIDER_SITE_OTHER): Payer: 59 | Admitting: Family Medicine

## 2020-01-09 ENCOUNTER — Encounter: Payer: Self-pay | Admitting: Family Medicine

## 2020-01-09 ENCOUNTER — Other Ambulatory Visit: Payer: Self-pay

## 2020-01-09 DIAGNOSIS — M222X2 Patellofemoral disorders, left knee: Secondary | ICD-10-CM | POA: Diagnosis not present

## 2020-01-09 NOTE — Patient Instructions (Signed)
Good to see you Brooke Sheppard See me again in 2 months

## 2020-01-09 NOTE — Assessment & Plan Note (Signed)
Patient has made some progress so far.  Discussed posture and ergonomics, which activities to do which wants to avoid.  Discussed icing regimen.  Patient should increase activity slowly.  I would like patient to still have only 9-hour shifts as possible for a maximum of 45 hours for the next 2 months.  Hoping that this continues to improve and should be nearly pain-free hopefully in 2 to 3 months.

## 2020-01-22 ENCOUNTER — Telehealth: Payer: Self-pay | Admitting: Family Medicine

## 2020-01-22 NOTE — Telephone Encounter (Signed)
Pt called, stated she gave [place] FMLA ppwk to complete at her last visit 01/09/2020. Her employer asked her to contact us as we "disapproved". Also, pt needs an extension note allowing her to continue her 9 hours days.

## 2020-01-22 NOTE — Telephone Encounter (Signed)
Called patient to notify. Left message.

## 2020-01-22 NOTE — Telephone Encounter (Signed)
Sent doctor's note to [employer]. Made some changes to FMLA form and resending now.

## 2020-02-04 NOTE — Telephone Encounter (Signed)
Returned patient call. Instructed patient to send documents again and that we would fix it. Patient voiced understanding and will be bringing documents in tomorrow morning.

## 2020-02-04 NOTE — Telephone Encounter (Signed)
Patient called stating that she received a letter requesting more information on her FMLA.  Please advise.  [phone]

## 2020-02-06 ENCOUNTER — Telehealth: Payer: Self-pay

## 2020-02-08 NOTE — Telephone Encounter (Signed)
Patient documents have been resent.

## 2020-03-11 ENCOUNTER — Encounter: Payer: Self-pay | Admitting: Family Medicine

## 2020-03-11 ENCOUNTER — Other Ambulatory Visit: Payer: Self-pay

## 2020-03-11 ENCOUNTER — Ambulatory Visit (INDEPENDENT_AMBULATORY_CARE_PROVIDER_SITE_OTHER): Payer: 59 | Admitting: Family Medicine

## 2020-03-11 DIAGNOSIS — M222X2 Patellofemoral disorders, left knee: Secondary | ICD-10-CM

## 2020-03-11 NOTE — Assessment & Plan Note (Signed)
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  Minimal arthritic changes on x-rays and ultrasound previously.  Patient has done relatively well but is only been able to do 9-hour shift still.  I do think that this could be a long-term for this individual.  Discussed bracing, proper shoes, doing physical therapy.  Can consider the possibility of other injections if necessary and advanced imaging.  Patient would want no surgical intervention so do not feel advanced imaging would warrant any change in medical management at this time.  Follow-up again 2 to 3 months total time with patient and reviewing chart 31 minutes

## 2020-03-11 NOTE — Patient Instructions (Signed)
See me again in 2 months oofos recovery sandals in the house

## 2020-03-11 NOTE — Progress Notes (Signed)
[facility] [address] [place] Phone: [phone] Subjective:   I Kandace Blitz am serving as a Education administrator for Dr. Hulan Saas.  This visit occurred during the SARS-CoV-2 public health emergency.  Safety protocols were in place, including screening questions prior to the visit, additional usage of staff PPE, and extensive cleaning of exam room while observing appropriate contact time as indicated for disinfecting solutions.   I'm seeing this patient by the request  of:  Marrian Salvage, FNP  CC: Knee pain follow-up  [id number]   23/24/2021 Patient has made some progress so far.  Discussed posture and ergonomics, which activities to do which wants to avoid.  Discussed icing regimen.  Patient should increase activity slowly.  I would like patient to still have only 9-hour shifts as possible for a maximum of 45 hours for the next 2 months.  Hoping that this continues to improve and should be nearly pain-free hopefully in 2 to 3 months.  Update 03/11/2020 Aamira Sedlak is a 29 y.o. female coming in with complaint of left knee pain. Patient states that some days its great and other days she is in pain. Making progress overall. Patient also got some new shoes.  Patient states that overall does seem to get better but if it works to many days or to any hours neuro starts having increasing discomfort and pain again.       Past Medical History:  Diagnosis Date  . Migraines    No past surgical history on file. Social History   Socioeconomic History  . Marital status: Single    Spouse name: Not on file  . Number of children: Not on file  . Years of education: Not on file  . Highest education level: Not on file  Occupational History  . Not on file  Tobacco Use  . Smoking status: Never Smoker  . Smokeless tobacco: Never Used  Substance and Sexual Activity  . Alcohol use: Yes  . Drug use: Not on file  . Sexual activity: Not on  file  Other Topics Concern  . Not on file  Social History Narrative  . Not on file   Social Determinants of Health   Financial Resource Strain:   . Difficulty of Paying Living Expenses:   Food Insecurity:   . Worried About Charity fundraiser in the Last Year:   . Arboriculturist in the Last Year:   Transportation Needs:   . Film/video editor (Medical):   Marland Kitchen Lack of Transportation (Non-Medical):   Physical Activity:   . Days of Exercise per Week:   . Minutes of Exercise per Session:   Stress:   . Feeling of Stress :   Social Connections:   . Frequency of Communication with Friends and Family:   . Frequency of Social Gatherings with Friends and Family:   . Attends Religious Services:   . Active Member of Clubs or Organizations:   . Attends Archivist Meetings:   Marland Kitchen Marital Status:    Allergies  Allergen Reactions  . Latex   . Other     Peaches cause swelling and itching   No family history on file. No current outpatient medications on file.   Reviewed prior external information including notes and imaging from  primary care provider As well as notes that were available from care everywhere and other healthcare systems.  Past medical history, social, surgical and family history all reviewed in  electronic medical record.  No pertanent information unless stated regarding to the chief complaint.   Review of Systems:  No headache, visual changes, nausea, vomiting, diarrhea, constipation, dizziness, abdominal pain, skin rash, fevers, chills, night sweats, weight loss, swollen lymph nodes, body aches, joint swelling, chest pain, shortness of breath, mood changes. POSITIVE muscle aches  Objective  Blood pressure 100/60, pulse 89, height 5\' 4"  (1.626 m), weight 174 lb (78.9 kg), SpO2 98 %.   General: No apparent distress alert and oriented x3 mood and affect normal, dressed appropriately.  HEENT: Pupils equal, extraocular movements intact  Respiratory:  Patient's speak in full sentences and does not appear short of breath  Cardiovascular: No lower extremity edema, non tender, no erythema  Neuro: Cranial nerves II through XII are intact, neurovascularly intact in all extremities with 2+ DTRs and 2+ pulses.  Gait normal with good balance and coordination.  MSK:  tender with full range of motion and good stability and symmetric strength and tone of shoulders, elbows, wrist, hip, and ankles bilaterally.    Patient's left knee has some mild lateral tracking of the patella.  Full range of motion.  No significant swelling noted today.  Very mild crepitus of the knee noted. Impression and Recommendations:      The above documentation has been reviewed and is accurate and complete , DO       Note: This dictation was prepared with Dragon dictation along with smaller phrase technology. Any transcriptional errors that result from this process are unintentional.

## 2020-03-12 DIAGNOSIS — Z0279 Encounter for issue of other medical certificate: Secondary | ICD-10-CM

## 2020-03-24 ENCOUNTER — Encounter: Payer: Self-pay | Admitting: Family Medicine

## 2020-05-12 ENCOUNTER — Ambulatory Visit: Payer: 59 | Admitting: Family Medicine

## 2020-05-12 NOTE — Progress Notes (Deleted)
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  Minimal arthritic changes on x-rays and ultrasound previously.  Patient has done relatively well but is only been able to do 9-hour shift still.  I do think that this could be a long-term for this individual.  Discussed bracing, proper shoes, doing physical therapy.  Can consider the possibility of other injections if necessary and advanced imaging.  Patient would want no surgical intervention so do not feel advanced imaging would warrant any change in medical management at this time.  Follow-up again 2 to 3 months total time with patient and reviewing chart 31 minutes  Update 05/12/2020 Brooke Sheppard is a 29 y.o. female coming in with complaint of patellofemoral syndrome left knee. Patient states        Past Medical History:  Diagnosis Date  . Migraines    No past surgical history on file. Social History   Socioeconomic History  . Marital status: Single    Spouse name: Not on file  . Number of children: Not on file  . Years of education: Not on file  . Highest education level: Not on file  Occupational History  . Not on file  Tobacco Use  . Smoking status: Never Smoker  . Smokeless tobacco: Never Used  Substance and Sexual Activity  . Alcohol use: Yes  . Drug use: Not on file  . Sexual activity: Not on file  Other Topics Concern  . Not on file  Social History Narrative  . Not on file   Social Determinants of Health   Financial Resource Strain:   . Difficulty of Paying Living Expenses:   Food Insecurity:   . Worried About Programme researcher, broadcasting/film/video in the Last Year:   . Barista in the Last Year:   Transportation Needs:   . Freight forwarder (Medical):   Marland Kitchen Lack of Transportation  (Non-Medical):   Physical Activity:   . Days of Exercise per Week:   . Minutes of Exercise per Session:   Stress:   . Feeling of Stress :   Social Connections:   . Frequency of Communication with Friends and Family:   . Frequency of Social Gatherings with Friends and Family:   . Attends Religious Services:   . Active Member of Clubs or Organizations:   . Attends Banker Meetings:   Marland Kitchen Marital Status:    Allergies  Allergen Reactions  . Latex   . Other     Peaches cause swelling and itching   No family history on file. No current outpatient medications on file.   Reviewed prior external information including notes and imaging from  primary care provider As well as notes that were available from care everywhere and other healthcare systems.  Past medical history, social, surgical and family history all reviewed in electronic medical record.  No pertanent information unless stated regarding to the chief complaint.   Review of Systems:  No headache, visual changes, nausea, vomiting, diarrhea, constipation, dizziness, abdominal pain, skin rash, fevers, chills, night sweats, weight loss, swollen lymph nodes, body aches, joint swelling, chest pain, shortness of breath, mood changes. POSITIVE muscle aches  Objective  There were no vitals taken for this visit.   General: No apparent distress alert and  oriented x3 mood and affect normal, dressed appropriately.  HEENT: Pupils equal, extraocular movements intact  Respiratory: Patient's speak in full sentences and does not appear short of breath  Cardiovascular: No lower extremity edema, non tender, no erythema  Neuro: Cranial nerves II through XII are intact, neurovascularly intact in all extremities with 2+ DTRs and 2+ pulses.  Gait normal with good balance and coordination.  MSK:  Non tender with full range of motion and good stability and symmetric strength and tone of shoulders, elbows, wrist, hip, knee and ankles  bilaterally.     Impression and Recommendations:     The above documentation has been reviewed and is accurate and complete Lyndal Pulley, DO       Note: This dictation was prepared with Dragon dictation along with smaller phrase technology. Any transcriptional errors that result from this process are unintentional.

## 2020-06-13 ENCOUNTER — Other Ambulatory Visit: Payer: Self-pay

## 2020-06-13 ENCOUNTER — Encounter: Payer: Self-pay | Admitting: Family Medicine

## 2020-06-13 ENCOUNTER — Ambulatory Visit (INDEPENDENT_AMBULATORY_CARE_PROVIDER_SITE_OTHER): Payer: 59 | Admitting: Family Medicine

## 2020-06-13 ENCOUNTER — Ambulatory Visit: Payer: Self-pay

## 2020-06-13 VITALS — BP 110/68 | HR 79 | Ht 64.0 in | Wt 174.0 lb

## 2020-06-13 DIAGNOSIS — S76312A Strain of muscle, fascia and tendon of the posterior muscle group at thigh level, left thigh, initial encounter: Secondary | ICD-10-CM | POA: Diagnosis not present

## 2020-06-13 DIAGNOSIS — M25562 Pain in left knee: Secondary | ICD-10-CM | POA: Diagnosis not present

## 2020-06-13 DIAGNOSIS — G8929 Other chronic pain: Secondary | ICD-10-CM

## 2020-06-13 NOTE — Progress Notes (Signed)
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  Minimal arthritic changes on x-rays and ultrasound previously.  Patient has done relatively well but is only been able to do 9-hour shift still.  I do think that this could be a long-term for this individual.  Discussed bracing, proper shoes, doing physical therapy.  Can consider the possibility of other injections if necessary and advanced imaging.  Patient would want no surgical intervention so do not feel advanced imaging would warrant any change in medical management at this time.  Follow-up again 2 to 3 months total time with patient and reviewing chart 31 minutes  Update 06/13/2020 Brooke Sheppard is a 29 y.o. female coming in with complaint of left knee pain. Patient states that her lateral hamstring is bothering her since last visit. Has good days and bad days with her knee pain. Pain always increases by end of the day. Ices knee at end of the day.      Past Medical History:  Diagnosis Date  . Migraines    No past surgical history on file. Social History   Socioeconomic History  . Marital status: Single    Spouse name: Not on file  . Number of children: Not on file  . Years of education: Not on file  . Highest education level: Not on file  Occupational History  . Not on file  Tobacco Use  . Smoking status: Never Smoker  . Smokeless tobacco: Never Used  Substance and Sexual Activity  . Alcohol use: Yes  . Drug use: Not on file  . Sexual activity: Not on file  Other Topics Concern  . Not on file  Social History Narrative  . Not on file   Social Determinants of Health   Financial Resource Strain:   . Difficulty of Paying Living Expenses:   Food Insecurity:   . Worried About  Programme researcher, broadcasting/film/video in the Last Year:   . Barista in the Last Year:   Transportation Needs:   . Freight forwarder (Medical):   Marland Kitchen Lack of Transportation (Non-Medical):   Physical Activity:   . Days of Exercise per Week:   . Minutes of Exercise per Session:   Stress:   . Feeling of Stress :   Social Connections:   . Frequency of Communication with Friends and Family:   . Frequency of Social Gatherings with Friends and Family:   . Attends Religious Services:   . Active Member of Clubs or Organizations:   . Attends Banker Meetings:   Marland Kitchen Marital Status:    Allergies  Allergen Reactions  . Latex   . Other     Peaches cause swelling and itching   No family history on file. No current outpatient medications on file.   Reviewed prior external information including notes and imaging from  primary care provider As well as notes that were available from care everywhere and other healthcare systems.  Past medical history, social, surgical and family history all reviewed in electronic medical record.  No pertanent information unless stated regarding to the chief complaint.   Review of Systems:  No headache, visual changes, nausea, vomiting, diarrhea, constipation, dizziness, abdominal pain, skin rash, fevers, chills, night sweats, weight loss,  swollen lymph nodes, body aches, joint swelling, chest pain, shortness of breath, mood changes. POSITIVE muscle aches  Objective  Blood pressure 110/68, pulse 79, height 5\' 4"  (1.626 m), weight 174 lb (78.9 kg), SpO2 97 %.   General: No apparent distress alert and oriented x3 mood and affect normal, dressed appropriately.  HEENT: Pupils equal, extraocular movements intact  Respiratory: Patient's speak in full sentences and does not appear short of breath  Cardiovascular: No lower extremity edema, non tender, no erythema  Neuro: Cranial nerves II through XII are intact, neurovascularly intact in all extremities with 2+  DTRs and 2+ pulses.  Gait normal with good balance and coordination.  MSK: Left knee continues to have a positive patellar grind test noted.  Patient does have full range of motion.  Tightness noted though in the mid substance of the hamstring noted.  Patient does have a scar from previous injury in this area.  No masses appreciated.  Tender to palpation  Limited musculoskeletal ultrasound was performed and interpreted by  Limited ultrasound of patient's hamstring shows that patient had some potential small amount of blood in the area.  No true tear though appreciated.  Just a mild hyperechoic changes    Impression and Recommendations:     The above documentation has been reviewed and is accurate and complete Judi Saa, DO       Note: This dictation was prepared with Dragon dictation along with smaller phrase technology. Any transcriptional errors that result from this process are unintentional.

## 2020-06-13 NOTE — Patient Instructions (Signed)
See me again in 6 weeks  [place]ling exercises

## 2020-06-13 NOTE — Assessment & Plan Note (Signed)
Appears to be more of a strain.  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  9-hour shift maximums.  Discussed compression, icing regimen.  Follow-up again in 4 to 8 weeks

## 2020-07-15 ENCOUNTER — Encounter (HOSPITAL_COMMUNITY): Payer: Self-pay

## 2020-07-15 ENCOUNTER — Emergency Department (HOSPITAL_COMMUNITY)
Admission: EM | Admit: 2020-07-15 | Discharge: 2020-07-15 | Disposition: A | Discharge status: Home / Self Care | Payer: 59 | Attending: Emergency Medicine | Admitting: Emergency Medicine

## 2020-07-15 ENCOUNTER — Other Ambulatory Visit: Payer: Self-pay

## 2020-07-15 DIAGNOSIS — Z5321 Procedure and treatment not carried out due to patient leaving prior to being seen by health care provider: Secondary | ICD-10-CM | POA: Diagnosis not present

## 2020-07-15 DIAGNOSIS — Z20822 Contact with and (suspected) exposure to covid-19: Secondary | ICD-10-CM | POA: Diagnosis not present

## 2020-07-15 DIAGNOSIS — R519 Headache, unspecified: Secondary | ICD-10-CM | POA: Insufficient documentation

## 2020-07-15 LAB — SARS CORONAVIRUS 2 BY RT PCR (HOSPITAL ORDER, PERFORMED IN ~~LOC~~ HOSPITAL LAB): SARS Coronavirus 2: NEGATIVE

## 2020-07-15 NOTE — ED Notes (Signed)
No answer for V/S re-check x1 Pt not seen in lobby

## 2020-07-15 NOTE — ED Notes (Signed)
I called patient name in the lobby and outside to recheck vitals and no one responded 

## 2020-07-15 NOTE — ED Triage Notes (Signed)
Patient arrived stating today at 5 she began having covid-19 symptoms, reports NVD, chills, and a headache.

## 2020-07-15 NOTE — ED Notes (Signed)
I called patient to recheck vitals and no one responded 

## 2020-07-24 ENCOUNTER — Ambulatory Visit: Payer: 59 | Admitting: Family Medicine

## 2020-07-24 NOTE — Progress Notes (Deleted)
[facility] [address] [place] [address] Phone: [phone] Subjective:    I'm seeing this patient by the request  of:  Olive Bass, FNP  CC:   [id number]   06/13/2020 Appears to be more of a strain.  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  9-hour shift maximums.  Discussed compression, icing regimen.  Follow-up again in 4 to 8 weeks  Update 07/24/2020 Brooke Sheppard is a 29 y.o. female coming in with complaint of left knee pain. Patient states        Past Medical History:  Diagnosis Date  . Migraines    No past surgical history on file. Social History   Socioeconomic History  . Marital status: Single    Spouse name: Not on file  . Number of children: Not on file  . Years of education: Not on file  . Highest education level: Not on file  Occupational History  . Not on file  Tobacco Use  . Smoking status: Never Smoker  . Smokeless tobacco: Never Used  Substance and Sexual Activity  . Alcohol use: Yes  . Drug use: Not on file  . Sexual activity: Not on file  Other Topics Concern  . Not on file  Social History Narrative  . Not on file   Social Determinants of Health   Financial Resource Strain:   . Difficulty of Paying Living Expenses: Not on file  Food Insecurity:   . Worried About Programme researcher, broadcasting/film/video in the Last Year: Not on file  . Ran Out of Food in the Last Year: Not on file  Transportation Needs:   . Lack of Transportation (Medical): Not on file  . Lack of Transportation (Non-Medical): Not on file  Physical Activity:   . Days of Exercise per Week: Not on file  . Minutes of Exercise per Session: Not on file  Stress:   . Feeling of Stress : Not on file  Social Connections:   . Frequency of Communication with Friends and Family: Not on file  . Frequency of Social Gatherings with Friends and Family: Not on file  . Attends Religious Services: Not on file  . Active Member of Clubs or  Organizations: Not on file  . Attends Banker Meetings: Not on file  . Marital Status: Not on file   Allergies  Allergen Reactions  . Latex   . Other     Peaches cause swelling and itching   No family history on file. No current outpatient medications on file.   Reviewed prior external information including notes and imaging from  primary care provider As well as notes that were available from care everywhere and other healthcare systems.  Past medical history, social, surgical and family history all reviewed in electronic medical record.  No pertanent information unless stated regarding to the chief complaint.   Review of Systems:  No headache, visual changes, nausea, vomiting, diarrhea, constipation, dizziness, abdominal pain, skin rash, fevers, chills, night sweats, weight loss, swollen lymph nodes, body aches, joint swelling, chest pain, shortness of breath, mood changes. POSITIVE muscle aches  Objective  Last menstrual period 07/08/2020.   General: No apparent distress alert and oriented x3 mood and affect normal, dressed appropriately.  HEENT: Pupils equal, extraocular movements intact  Respiratory: Patient's speak in full sentences and does not appear short of breath  Cardiovascular: No lower extremity edema, non tender, no erythema  Neuro: Cranial nerves II through XII are intact,  neurovascularly intact in all extremities with 2+ DTRs and 2+ pulses.  Gait normal with good balance and coordination.  MSK:  Non tender with full range of motion and good stability and symmetric strength and tone of shoulders, elbows, wrist, hip, knee and ankles bilaterally.     Impression and Recommendations:     The above documentation has been reviewed and is accurate and complete Wilford Grist       Note: This dictation was prepared with Dragon dictation along with smaller phrase technology. Any transcriptional errors that result from this process are unintentional.

## 2020-10-02 ENCOUNTER — Ambulatory Visit: Payer: 59 | Admitting: Family Medicine

## 2020-10-02 NOTE — Progress Notes (Deleted)
[name] [address] [place] [address] Phone: [phone] Subjective:    I'm seeing this patient by the request  of:  Olive Bass, FNP  CC:   [id number]   06/13/2020 Appears to be more of a strain.  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  9-hour shift maximums.  Discussed compression, icing regimen.  Follow-up again in 4 to 8 weeks  Update 10/02/2020 Brooke Sheppard is a 29 y.o. female coming in with complaint of ***  Onset-  Location Duration-  Character- Aggravating factors- Reliving factors-  Therapies tried-  Severity-     Past Medical History:  Diagnosis Date  . Migraines    No past surgical history on file. Social History   Socioeconomic History  . Marital status: Single    Spouse name: Not on file  . Number of children: Not on file  . Years of education: Not on file  . Highest education level: Not on file  Occupational History  . Not on file  Tobacco Use  . Smoking status: Never Smoker  . Smokeless tobacco: Never Used  Substance and Sexual Activity  . Alcohol use: Yes  . Drug use: Not on file  . Sexual activity: Not on file  Other Topics Concern  . Not on file  Social History Narrative  . Not on file   Social Determinants of Health   Financial Resource Strain:   . Difficulty of Paying Living Expenses: Not on file  Food Insecurity:   . Worried About Programme researcher, broadcasting/film/video in the Last Year: Not on file  . Ran Out of Food in the Last Year: Not on file  Transportation Needs:   . Lack of Transportation (Medical): Not on file  . Lack of Transportation (Non-Medical): Not on file  Physical Activity:   . Days of Exercise per Week: Not on file  . Minutes of Exercise per Session: Not on file  Stress:   . Feeling of Stress : Not on file  Social Connections:   . Frequency of Communication with Friends and Family: Not on file  . Frequency of Social Gatherings with Friends and Family: Not  on file  . Attends Religious Services: Not on file  . Active Member of Clubs or Organizations: Not on file  . Attends Banker Meetings: Not on file  . Marital Status: Not on file   Allergies  Allergen Reactions  . Latex   . Other     Peaches cause swelling and itching   No family history on file. No current outpatient medications on file.   Reviewed prior external information including notes and imaging from  primary care provider As well as notes that were available from care everywhere and other healthcare systems.  Past medical history, social, surgical and family history all reviewed in electronic medical record.  No pertanent information unless stated regarding to the chief complaint.   Review of Systems:  No headache, visual changes, nausea, vomiting, diarrhea, constipation, dizziness, abdominal pain, skin rash, fevers, chills, night sweats, weight loss, swollen lymph nodes, body aches, joint swelling, chest pain, shortness of breath, mood changes. POSITIVE muscle aches  Objective  There were no vitals taken for this visit.   General: No apparent distress alert and oriented x3 mood and affect normal, dressed appropriately.  HEENT: Pupils equal, extraocular movements intact  Respiratory: Patient's speak in full sentences and does not appear short of breath  Cardiovascular: No lower extremity edema,  non tender, no erythema  Neuro: Cranial nerves II through XII are intact, neurovascularly intact in all extremities with 2+ DTRs and 2+ pulses.  Gait normal with good balance and coordination.  MSK:  Non tender with full range of motion and good stability and symmetric strength and tone of shoulders, elbows, wrist, hip, knee and ankles bilaterally.     Impression and Recommendations:     The above documentation has been reviewed and is accurate and complete Wilford Grist

## 2020-10-06 NOTE — Progress Notes (Signed)
[name] [address] [place] [address] Phone: [phone] Subjective:    I'm seeing this patient by the request  of:  Olive Bass, FNP  CC: knee pain follow up   Virtual Visit via Video Note  I connected with Myrtis Waterfield on 10/07/20 at  4:00 PM EST by a video enabled telemedicine application and verified that I am speaking with the correct person using two identifiers. Patient is alone on the call Location: Patient: Patient is on telephone call and difficulty with the virtual platform Provider: In office setting   I discussed the limitations of evaluation and management by telemedicine and the availability of in person appointments. The patient expressed understanding and agreed to proceed.  History of Present Illness: Update 10/07/2020 Brooke Sheppard is a 29 y.o. female coming in with complaint of left leg, hamstring strain. Patient states continues to have pain fairly regularly patient states that on certain days when she walks 12 to 15 miles she has worsening pain. Patient states that there is sometimes some pain but would never say that she has instability. He does state that from the very beginning of this has improved but has never been when she works multiple days in a row pain-free. Patient states when she has days off she seems to do well. Seems to be associated with her job mostly  Patient's x-rays from 2020 showed only mild medial compartment arthritis but otherwise unremarkable. Patient's ultrasound showed only trace effusion of the patellofemoral joint but otherwise unremarkable.   Observations/Objective: Alert and oriented x3.   Assessment and Plan: The patient is a 29 year old female who had more of a patellofemoral syndrome as well as patellar tendinitis. Seems to be more associated with her having significant walking. We have had her on restrictions of 9-hour shifts for nearly 1 year. With patient has improved but  is not pain-free. I do feel MRI is necessary with this. We have discussed about this previously which patient has declined because she continued to make some progress. At this point secondary to the longevity of the symptoms advanced imaging is warranted. We will continue the same restrictions for now until after the imaging to discuss further treatment options.  Follow Up Instructions: After imaging    I discussed the assessment and treatment plan with the patient. The patient was provided an opportunity to ask questions and all were answered. The patient agreed with the plan and demonstrated an understanding of the instructions.   The patient was advised to call back or seek an in-person evaluation if the symptoms worsen or if the condition fails to improve as anticipated.  I provided 15 minutes of  during this encounter.   Judi Saa, DO    OIZ:[id number]   06/13/2020 Appears to be more of a strain.  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  9-hour shift maximums.  Discussed compression, icing regimen.  Follow-up again in 4 to 8 weeks      Past Medical History:  Diagnosis Date  . Migraines    No past surgical history on file. Social History   Socioeconomic History  . Marital status: Single    Spouse name: Not on file  . Number of children: Not on file  . Years of education: Not on file  . Highest education level: Not on file  Occupational History  . Not on file  Tobacco Use  . Smoking status: Never Smoker  . Smokeless tobacco: Never Used  Substance and Sexual Activity  . Alcohol use: Yes  . Drug use: Not on file  . Sexual activity: Not on file  Other Topics Concern  . Not on file  Social History Narrative  . Not on file   Social Determinants of Health   Financial Resource Strain:   . Difficulty of Paying Living Expenses: Not on file  Food Insecurity:   . Worried About Programme researcher, broadcasting/film/video in the Last Year: Not on file  . Ran Out of Food in the  Last Year: Not on file  Transportation Needs:   . Lack of Transportation (Medical): Not on file  . Lack of Transportation (Non-Medical): Not on file  Physical Activity:   . Days of Exercise per Week: Not on file  . Minutes of Exercise per Session: Not on file  Stress:   . Feeling of Stress : Not on file  Social Connections:   . Frequency of Communication with Friends and Family: Not on file  . Frequency of Social Gatherings with Friends and Family: Not on file  . Attends Religious Services: Not on file  . Active Member of Clubs or Organizations: Not on file  . Attends Banker Meetings: Not on file  . Marital Status: Not on file   Allergies  Allergen Reactions  . Latex   . Other     Peaches cause swelling and itching   No family history on file. No current outpatient medications on file.   Reviewed prior external information including notes and imaging from  primary care provider As well as notes that were available from care everywhere and other healthcare systems.  Past medical history, social, surgical and family history all reviewed in electronic medical record.  No pertanent information unless stated regarding to the chief complaint.   Review of Systems:  No headache, visual changes, nausea, vomiting, diarrhea, constipation, dizziness, abdominal pain, skin rash, fevers, chills, night sweats, weight loss, swollen lymph nodes, body aches, joint swelling, chest pain, shortness of breath, mood changes. POSITIVE muscle aches  Objective  There were no vitals taken for this visit.   General: No apparent distress alert and oriented x3 mood and affect normal, dressed appropriately.      Impression and Recommendations:     The above documentation has been reviewed and is accurate and complete Judi Saa, DO

## 2020-10-07 ENCOUNTER — Ambulatory Visit (INDEPENDENT_AMBULATORY_CARE_PROVIDER_SITE_OTHER): Payer: 59 | Admitting: Family Medicine

## 2020-10-07 ENCOUNTER — Encounter: Payer: Self-pay | Admitting: Family Medicine

## 2020-10-07 DIAGNOSIS — M222X2 Patellofemoral disorders, left knee: Secondary | ICD-10-CM | POA: Diagnosis not present

## 2020-10-07 DIAGNOSIS — S76312A Strain of muscle, fascia and tendon of the posterior muscle group at thigh level, left thigh, initial encounter: Secondary | ICD-10-CM | POA: Diagnosis not present

## 2020-10-07 DIAGNOSIS — M7652 Patellar tendinitis, left knee: Secondary | ICD-10-CM

## 2020-10-16 ENCOUNTER — Telehealth: Payer: Self-pay | Admitting: Family Medicine

## 2020-10-16 NOTE — Telephone Encounter (Signed)
Work note written and patient notified.

## 2020-10-16 NOTE — Telephone Encounter (Signed)
Pt called, work is causing her knee pain to be unbearable again. Asking if Dr. Katrinka Blazing would write her out of work for next week.  I gave pt [facility] # as it looks like her MRI is ready for scheduling.

## 2020-10-16 NOTE — Telephone Encounter (Signed)
Can we decrease her to 20 hours a week for 3 weeks

## 2020-10-17 ENCOUNTER — Encounter: Payer: Self-pay | Admitting: *Deleted

## 2020-11-02 ENCOUNTER — Ambulatory Visit
Admission: RE | Admit: 2020-11-02 | Discharge: 2020-11-02 | Disposition: A | Discharge status: Home / Self Care | Payer: 59 | Source: Ambulatory Visit | Attending: Family Medicine | Admitting: Family Medicine

## 2020-11-02 ENCOUNTER — Other Ambulatory Visit: Payer: Self-pay

## 2020-11-02 DIAGNOSIS — M7652 Patellar tendinitis, left knee: Secondary | ICD-10-CM

## 2020-11-02 DIAGNOSIS — S76312A Strain of muscle, fascia and tendon of the posterior muscle group at thigh level, left thigh, initial encounter: Secondary | ICD-10-CM

## 2020-11-02 DIAGNOSIS — M222X2 Patellofemoral disorders, left knee: Secondary | ICD-10-CM

## 2021-08-12 IMAGING — MR MR KNEE*L* W/O CM
6 series · 40 of 40 positions shown · non-contrast
Comparison: Plain films left knee 07/27/2019.

CLINICAL DATA: Chronic left knee pain.  No known injury.

EXAM:
MRI OF THE LEFT KNEE WITHOUT CONTRAST
TECHNIQUE: Multiplanar, multisequence MR imaging of the knee was performed. No
intravenous contrast was administered.

[Series 6: T2 fat-sat · axial · left · 4.0mm · 0.62mm/px · z∈[-77,+76]mm · 9 of 36 slices shown (1 of 3)]
[im 1/36]
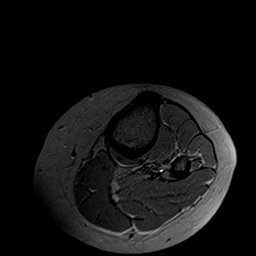
[im 5/36]
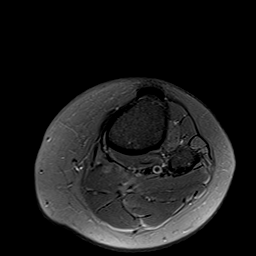
[im 9/36]
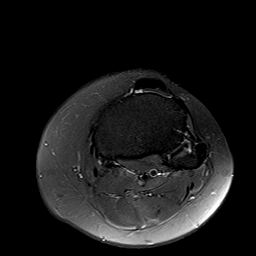
[im 14/36]
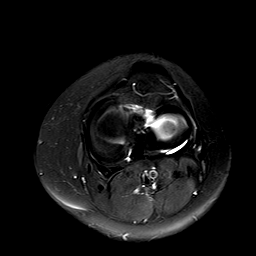
[im 18/36]
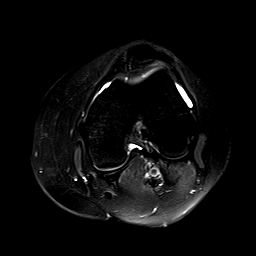
[im 22/36]
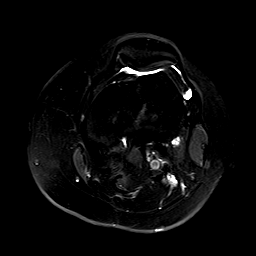
[im 27/36]
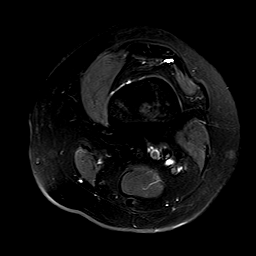
[im 31/36]
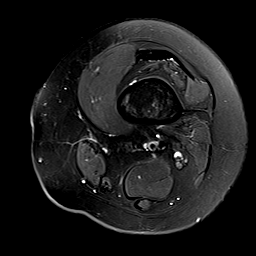
[im 36/36]
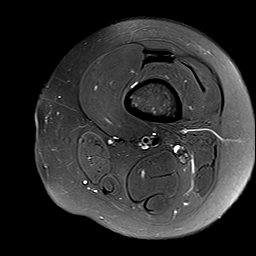

[Series 7: T2 fat-sat · coronal · left · 4.0mm · 0.59mm/px · 7 of 28 slices shown (2 of 3)]
[im 1/28]
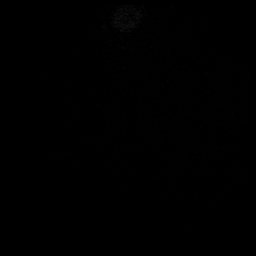
[im 5/28]
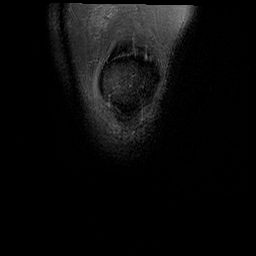
[im 10/28]
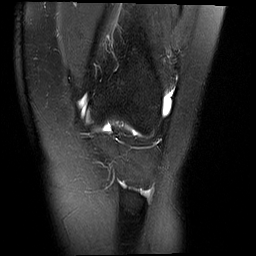
[im 14/28]
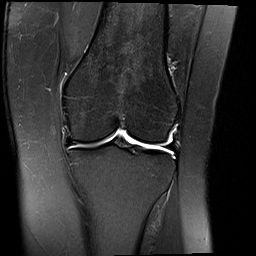
[im 19/28]
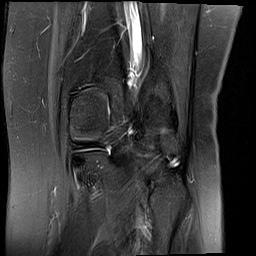
[im 23/28]
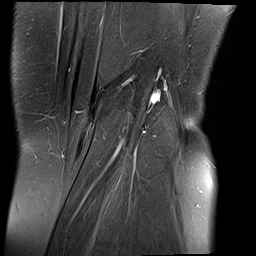
[im 28/28]
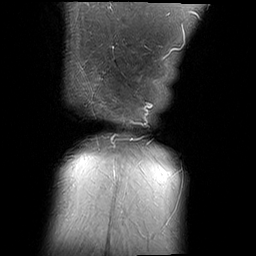

[Series 8: T1 · coronal · left · 4.0mm · 0.59mm/px · 6 of 28 slices shown]
[im 1/28]
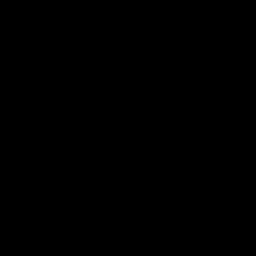
[im 6/28]
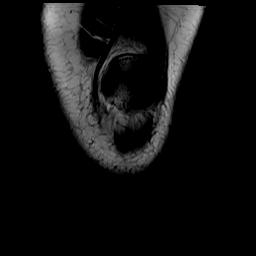
[im 11/28]
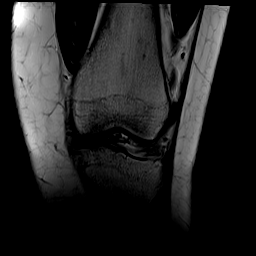
[im 17/28]
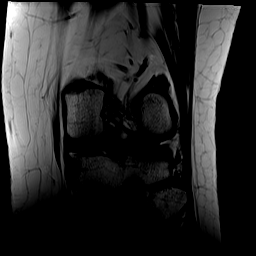
[im 22/28]
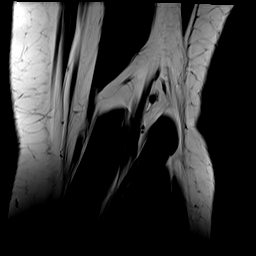
[im 28/28]
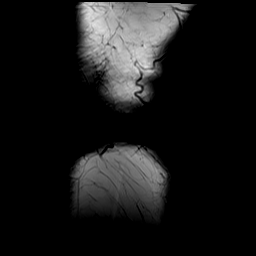

[Series 9: PD fat-sat · coronal · left · 3.0mm · 0.47mm/px · 6 of 28 slices shown (1 of 2)]
[im 1/28]
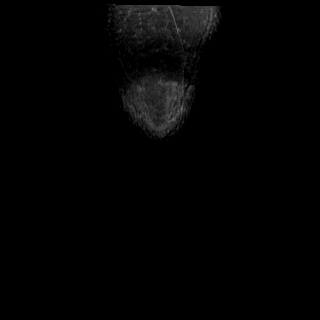
[im 6/28]
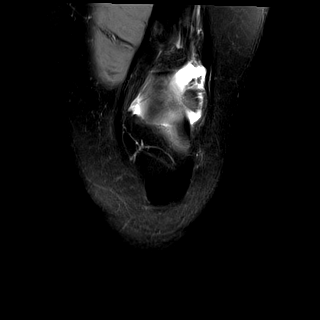
[im 11/28]
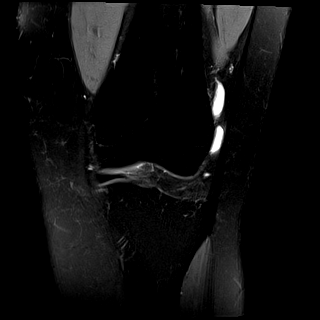
[im 17/28]
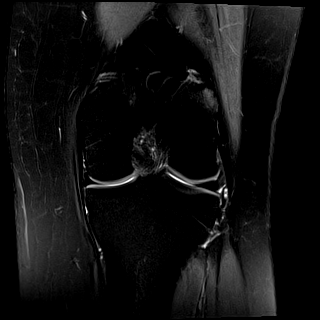
[im 22/28]
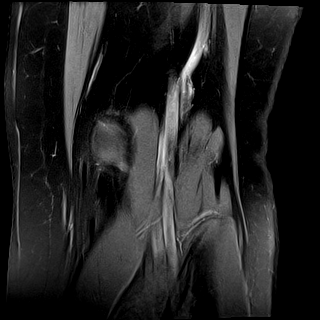
[im 28/28]
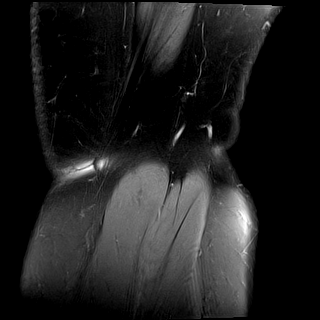

[Series 10: PD fat-sat · sagittal · left · 3.0mm · 0.39mm/px · 6 of 28 slices shown (2 of 2)]
[im 1/28]
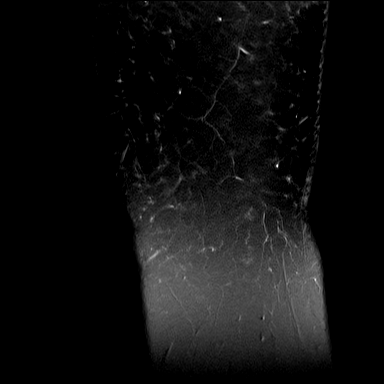
[im 6/28]
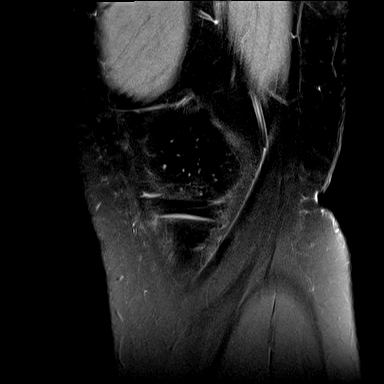
[im 11/28]
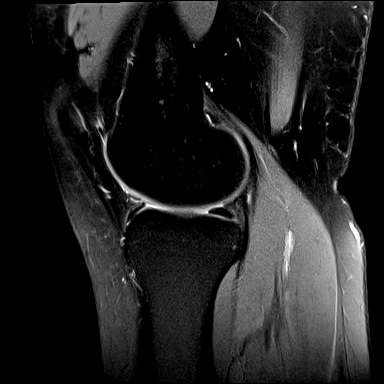
[im 17/28]
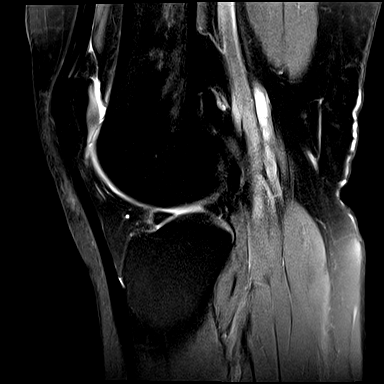
[im 22/28]
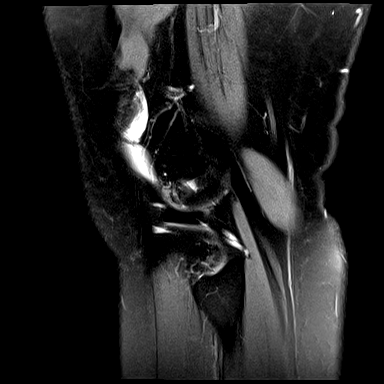
[im 28/28]
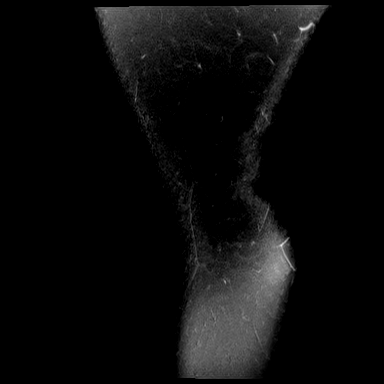

[Series 11: T2 fat-sat · sagittal · left · 3.0mm · 0.39mm/px · 6 of 28 slices shown (3 of 3)]
[im 1/28]
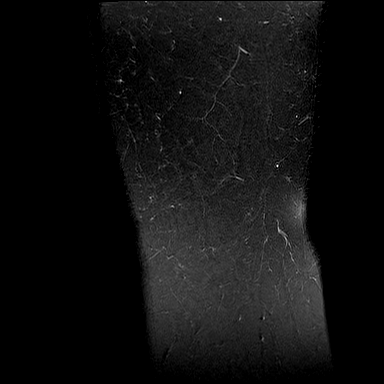
[im 6/28]
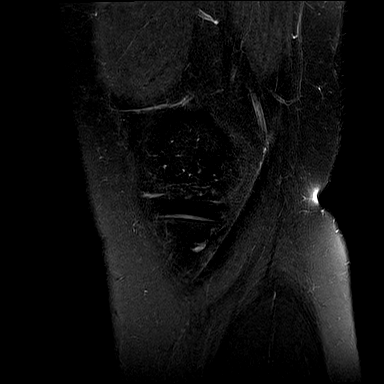
[im 11/28]
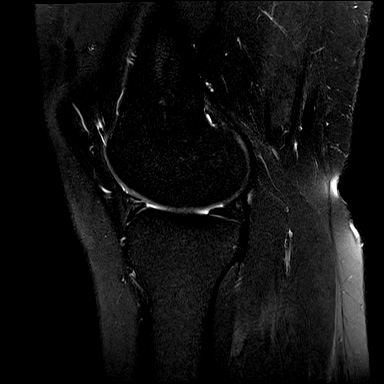
[im 17/28]
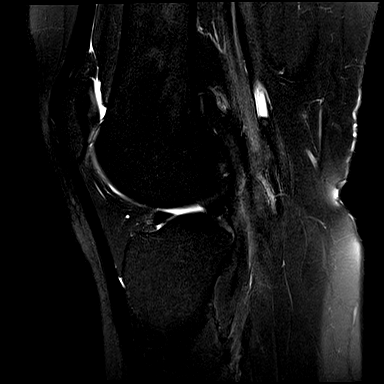
[im 22/28]
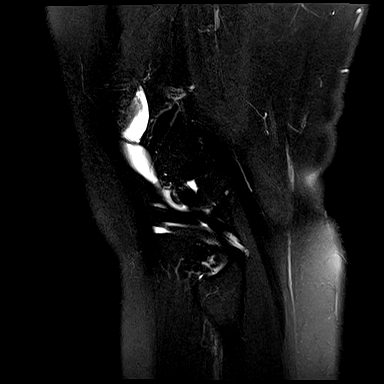
[im 28/28]
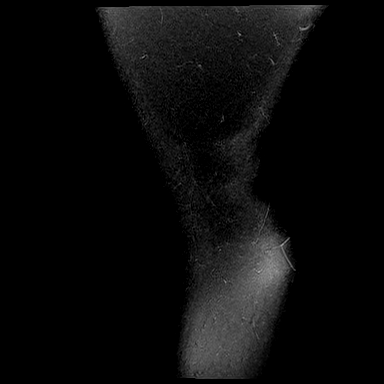

[40 of 40 positions shown; findings below may reference images not displayed]

FINDINGS: MENISCI

Medial meniscus: Intact.

Lateral meniscus: Intact.

LIGAMENTS

Cruciates:  Intact.

Collaterals:  Intact.

CARTILAGE

Patellofemoral:  Normal.

Medial:  Normal.

Lateral:  Normal.

Joint:  Trace amount of joint fluid.

Popliteal Fossa:  No Baker's cyst.

Extensor Mechanism:  Intact.

Bones:  Normal marrow signal throughout.

Other: None.
IMPRESSION: Normal MRI left knee.
